# Patient Record
Sex: Male | Born: 2000 | Race: Black or African American | Hispanic: No | Marital: Single | Smoking: Never smoker
Health system: Southern US, Community
[De-identification: ages and names within clinical notes are randomized; demographics above are authoritative.]

## PROBLEM LIST (undated history)

## (undated) DIAGNOSIS — F988 Other specified behavioral and emotional disorders with onset usually occurring in childhood and adolescence: Secondary | ICD-10-CM

## (undated) DIAGNOSIS — Z9109 Other allergy status, other than to drugs and biological substances: Secondary | ICD-10-CM

## (undated) DIAGNOSIS — M84362A Stress fracture, left tibia, initial encounter for fracture: Secondary | ICD-10-CM

## (undated) HISTORY — PX: TIBIA FRACTURE SURGERY: SHX806

---
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
## 2020-12-07 DIAGNOSIS — T782XXA Anaphylactic shock, unspecified, initial encounter: Secondary | ICD-10-CM | POA: Insufficient documentation

## 2020-12-07 DIAGNOSIS — J45909 Unspecified asthma, uncomplicated: Secondary | ICD-10-CM | POA: Insufficient documentation

## 2020-12-07 DIAGNOSIS — Z7951 Long term (current) use of inhaled steroids: Secondary | ICD-10-CM | POA: Diagnosis not present

## 2020-12-07 DIAGNOSIS — L500 Allergic urticaria: Secondary | ICD-10-CM | POA: Insufficient documentation

## 2020-12-08 ENCOUNTER — Emergency Department (HOSPITAL_BASED_OUTPATIENT_CLINIC_OR_DEPARTMENT_OTHER)
Admission: EM | Admit: 2020-12-08 | Discharge: 2020-12-08 | Disposition: A | Discharge status: Home / Self Care | Payer: No Typology Code available for payment source | Attending: Emergency Medicine | Admitting: Emergency Medicine

## 2020-12-08 ENCOUNTER — Other Ambulatory Visit: Payer: Self-pay

## 2020-12-08 ENCOUNTER — Encounter (HOSPITAL_BASED_OUTPATIENT_CLINIC_OR_DEPARTMENT_OTHER): Payer: Self-pay | Admitting: *Deleted

## 2020-12-08 DIAGNOSIS — T782XXA Anaphylactic shock, unspecified, initial encounter: Secondary | ICD-10-CM

## 2020-12-08 LAB — TROPONIN I (HIGH SENSITIVITY): Troponin I (High Sensitivity): <2 ng/L (ref <18)

## 2020-12-08 MED ORDER — EPINEPHRINE 0.3 MG/0.3ML IJ SOAJ
0.3000 mg | Freq: Once | INTRAMUSCULAR | Status: AC
Start: 2020-12-08 — End: 2020-12-08

## 2020-12-08 MED ORDER — DIPHENHYDRAMINE HCL 50 MG/ML IJ SOLN
25.0000 mg | Freq: Once | INTRAMUSCULAR | Status: AC
  Administered 2020-12-08: 25 mg via INTRAVENOUS

## 2020-12-08 MED ORDER — FAMOTIDINE IN NACL 20-0.9 MG/50ML-% IV SOLN
INTRAVENOUS | Status: AC
  Administered 2020-12-08: 20 mg via INTRAVENOUS
  Filled 2020-12-08: qty 50

## 2020-12-08 MED ORDER — METHYLPREDNISOLONE SODIUM SUCC 125 MG IJ SOLR: 125.0000 mg | Freq: Once | INTRAMUSCULAR | Status: AC

## 2020-12-08 MED ORDER — DIPHENHYDRAMINE HCL 50 MG/ML IJ SOLN
INTRAMUSCULAR | Status: AC
  Filled 2020-12-08: qty 1

## 2020-12-08 MED ORDER — METHYLPREDNISOLONE SODIUM SUCC 125 MG IJ SOLR
INTRAMUSCULAR | Status: AC
  Administered 2020-12-08: 125 mg via INTRAVENOUS
  Filled 2020-12-08: qty 2

## 2020-12-08 MED ORDER — EPINEPHRINE 0.3 MG/0.3ML IJ SOAJ: 0.3000 mg | INTRAMUSCULAR | 0 refills | Status: DC | PRN

## 2020-12-08 MED ORDER — PREDNISONE 50 MG PO TABS: ORAL_TABLET | ORAL | 0 refills | Status: DC

## 2020-12-08 MED ORDER — PREDNISONE 50 MG PO TABS: ORAL_TABLET | ORAL | 0 refills | Status: AC

## 2020-12-08 MED ORDER — EPINEPHRINE 0.3 MG/0.3ML IJ SOAJ
INTRAMUSCULAR | Status: AC
  Administered 2020-12-08: 0.3 mg via INTRAMUSCULAR
  Filled 2020-12-08: qty 0.3

## 2020-12-08 MED ORDER — EPINEPHRINE 0.3 MG/0.3ML IJ SOAJ: 0.3000 mg | INTRAMUSCULAR | 0 refills | Status: AC | PRN

## 2020-12-08 MED ORDER — FAMOTIDINE IN NACL 20-0.9 MG/50ML-% IV SOLN: 20.0000 mg | Freq: Once | INTRAVENOUS | Status: AC

## 2020-12-08 NOTE — ED Notes (Signed)
MD to triage to eval pt

## 2020-12-08 NOTE — ED Triage Notes (Addendum)
C/o allergic reaction to unknown x 30 mins , facial swelling including lips,hives to arms chest and back and reports throat feeling full, , PTA benadryl 2 tsp

## 2020-12-08 NOTE — Discharge Instructions (Signed)
Take the steroids as prescribed.  Use Benadryl as needed for itching.  Epinephrine pen is for severe allergic reaction with difficulty breathing, chest pain, throat swelling, tongue swelling only.  If you use the epinephrine pen you must come to the hospital afterwards. Return to the ED if you develop any other concerns.

## 2020-12-08 NOTE — ED Provider Notes (Signed)
[facility] EMERGENCY DEPARTMENT Provider Note   CSN: [id number] Arrival date & time: 12/07/20  2358     History Chief Complaint  Patient presents with   Allergic Reaction    Luke Nguyen is a 20 y.o. male.  Patient here with allergic reaction to unknown allergen.  He was at a [place] prior to arrival.  He developed tightness in his throat with itching, facial swelling, hives to his arms, chest, back and throat feeling full and tight and itching.  He took Benadryl at home without relief.  Has known allergy to amoxicillin and shellfish.  Does not believe he had any shellfish.  This is never happened before.  No chest pain or shortness of breath. Complains of tightness to his throat, lip swelling, hives to his arms, chest and back.  Denies any shortness of breath.  States his throat is starting to feel better.  The history is provided by the patient.  Allergic Reaction Presenting symptoms: difficulty swallowing and rash       Past Medical History:  Diagnosis Date   ADD (attention deficit disorder)    no current med.   Asthma    daily and prn inhalers   Environmental allergies    takes weekly allergy shots   Stress fracture of left tibia 02/2016    Patient Active Problem List   Diagnosis Date Noted   Stress fracture of left tibia 01/29/2016   Sports physical 10/24/2015    Past Surgical History:  Procedure Laterality Date   TIBIA FRACTURE SURGERY     TIBIA IM NAIL INSERTION Left 03/12/2016   Procedure: INTRAMEDULLARY (IM) NAIL TIBIA;  Surgeon: Jodi Geralds, MD;  Location: [facility];  Service: Orthopedics;  Laterality: Left;   TONSILLECTOMY AND ADENOIDECTOMY  03/11/2004       Family History  Problem Relation Age of Onset   Von Willebrand disease Mother    Asthma Mother    Asthma Sister    Congestive Heart Failure Father    Diabetes Maternal Grandmother    Hypertension Maternal Grandmother    Heart disease Maternal Grandfather     Diabetes Paternal Grandmother    Cirrhosis Paternal Grandfather     Social History   Tobacco Use   Smoking status: Never   Smokeless tobacco: Never  Vaping Use   Vaping Use: Never used  Substance Use Topics   Alcohol use: No   Drug use: No    Home Medications Prior to Admission medications   Medication Sig Start Date End Date Taking? Authorizing Provider  albuterol (PROVENTIL HFA;VENTOLIN HFA) 108 (90 Base) MCG/ACT inhaler INHALE 2 PUFFS INTO THE LUNGS EVERY 4 HOURS AS NEEDED FOR WHEEZING OR SHORTNESS OF BREATH 11/20/17   Padgett, Pilar Grammes, MD  azelastine (ASTELIN) 0.1 % nasal spray Place 2 sprays into both nostrils 2 (two) times daily. Use in each nostril as directed 09/18/16   Marcelyn Bruins, MD  cephALEXin (KEFLEX) 500 MG capsule Take 1 capsule (500 mg total) by mouth 4 (four) times daily. 01/09/17   Law, Waylan Boga, PA-C  cetirizine (ZYRTEC) 10 MG tablet Take one tablet once a day as needed for runny nose. 07/28/17   Marcelyn Bruins, MD  fluticasone Aleda Grana) 50 MCG/ACT nasal spray Place 2 sprays into both nostrils every morning. 09/18/16   Padgett, Pilar Grammes, MD  fluticasone (FLOVENT HFA) 110 MCG/ACT inhaler Inhale 2 puffs into the lungs 2 (two) times daily. 09/18/16   Marcelyn Bruins, MD  hydrocortisone  2.5 % lotion Apply topically 2 (two) times daily. 01/09/17   Law, Waylan Boga, PA-C  montelukast (SINGULAIR) 10 MG tablet Take 1 tablet (10 mg total) by mouth at bedtime. 05/04/17   Marcelyn Bruins, MD    Allergies    Shellfish allergy and Amoxicillin  Review of Systems   Review of Systems  Constitutional:  Negative for activity change, appetite change and fever.  HENT:  Positive for trouble swallowing and voice change. Negative for congestion.   Respiratory:  Negative for cough, chest tightness and shortness of breath.   Cardiovascular:  Negative for chest pain.  Gastrointestinal:  Negative for nausea and vomiting.   Genitourinary:  Negative for dysuria and hematuria.  Musculoskeletal:  Negative for arthralgias and myalgias.  Skin:  Positive for rash.  Neurological:  Negative for dizziness, weakness and headaches.   all other systems are negative except as noted in the HPI and PMH.   Physical Exam Updated Vital Signs BP 119/81   Pulse 79   Temp 97.9 F (36.6 C) (Oral)   Resp 16   Ht 6\' 2"  (1.88 m)   Wt 112 kg   SpO2 95%   BMI 31.71 kg/m   Physical Exam Vitals and nursing note reviewed.  Constitutional:      General: He is not in acute distress.    Appearance: He is well-developed.  HENT:     Head: Normocephalic and atraumatic.     Mouth/Throat:     Pharynx: No oropharyngeal exudate.     Comments: Swelling of lips.  Tongue appears normal.  No drooling or stridor.  Oropharynx is clear.  Eyes:     Conjunctiva/sclera: Conjunctivae normal.     Pupils: Pupils are equal, round, and reactive to light.  Neck:     Comments: No meningismus. Cardiovascular:     Rate and Rhythm: Normal rate and regular rhythm.     Heart sounds: Normal heart sounds. No murmur heard. Pulmonary:     Effort: Pulmonary effort is normal. No respiratory distress.     Breath sounds: Normal breath sounds.  Abdominal:     Palpations: Abdomen is soft.     Tenderness: There is no abdominal tenderness. There is no guarding or rebound.  Musculoskeletal:        General: No tenderness. Normal range of motion.     Cervical back: Normal range of motion and neck supple.  Skin:    General: Skin is warm.     Findings: Rash present.     Comments: Diffuse urticaria involving arms, back, chest and abdomen  Neurological:     Mental Status: He is alert and oriented to person, place, and time.     Cranial Nerves: No cranial nerve deficit.     Motor: No abnormal muscle tone.     Coordination: Coordination normal.     Comments:  5/5 strength throughout. CN 2-12 intact.Equal grip strength.   Psychiatric:        Behavior:  Behavior normal.    ED Results / Procedures / Treatments   Labs (all labs ordered are listed, but only abnormal results are displayed) Labs Reviewed  TROPONIN I (HIGH SENSITIVITY)  TROPONIN I (HIGH SENSITIVITY)    EKG EKG Interpretation  Date/Time:  Saturday December 08 2020 04:12:02 EDT Ventricular Rate:  65 PR Interval:  139 QRS Duration: 95 QT Interval:  373 QTC Calculation: 388 R Axis:   69 Text Interpretation: Sinus rhythm ST elev, probable normal early repol pattern No significant change  was found Confirmed by Glynn Octave [phone]) on 12/08/2020 4:14:52 AM  Radiology No results found.  Procedures .Critical Care Performed by: Glynn Octave, MD Authorized by: Glynn Octave, MD   Critical care provider statement:    Critical care time (minutes):  45   Critical care was time spent personally by me on the following activities:  Discussions with consultants, evaluation of patient's response to treatment, examination of patient, ordering and performing treatments and interventions, ordering and review of laboratory studies, ordering and review of radiographic studies, pulse oximetry, re-evaluation of patient's condition, obtaining history from patient or surrogate and review of old charts (anaphylaxis)   Medications Ordered in ED Medications  EPINEPHrine (EPI-PEN) 0.3 mg/0.3 mL injection (has no administration in time range)  EPINEPHrine (EPI-PEN) injection 0.3 mg (has no administration in time range)  methylPREDNISolone sodium succinate (SOLU-MEDROL) 125 mg/2 mL injection 125 mg (has no administration in time range)  diphenhydrAMINE (BENADRYL) injection 25 mg (has no administration in time range)  famotidine (PEPCID) IVPB 20 mg premix (has no administration in time range)    ED Course  I have reviewed the triage vital signs and the nursing notes.  Pertinent labs & imaging results that were available during my care of the patient were reviewed by me and  considered in my medical decision making (see chart for details).    MDM Rules/Calculators/A&P                          Allergic reaction with throat tightness and lip swelling consistent with anaphylaxis.  He will be given epinephrine, steroids, Benadryl and antihistamines  Patient observed in the ED with no deterioration in condition.  Vitals remained stable.  No hypoxia or increased work of breathing.  EKG does show some ST elevation in V2, V3 and V4, more progressed from 2016.  No reciprocal change.  Likely early repolarization.  Patient with no chest pain or shortness of breath.  Discussed with Dr. Welton Flakes cardiology who reviewed EKG and agrees unlikely to be STEMI given presentation but does recommend troponin.  Observed for 4 hours after epinephrine injection with no deterioration.  Feels improved.  Throat feels normal.  Rash has resolved.  No chest pain or shortness of breath.  Troponin was negative.  Patient asymptomatic.  Rash has resolved.  No chest pain or shortness of breath. Low suspicion for ACS with negative troponin 4 hours after initial presentation.  Discussed steroids and antihistamines for home use.  Epinephrine pen given with instructions.  Return precautions discussed. Final Clinical Impression(s) / ED Diagnoses Final diagnoses:  Anaphylaxis, initial encounter    Rx / DC Orders ED Discharge Orders     None        Perris Tripathi, Jeannett Senior, MD 12/08/20 [phone]

## 2021-01-31 ENCOUNTER — Other Ambulatory Visit: Payer: Self-pay

## 2021-01-31 ENCOUNTER — Encounter (HOSPITAL_BASED_OUTPATIENT_CLINIC_OR_DEPARTMENT_OTHER): Payer: Self-pay

## 2021-01-31 ENCOUNTER — Emergency Department (HOSPITAL_BASED_OUTPATIENT_CLINIC_OR_DEPARTMENT_OTHER)
Admission: EM | Admit: 2021-01-31 | Discharge: 2021-01-31 | Disposition: A | Discharge status: Home / Self Care | Payer: No Typology Code available for payment source | Attending: Emergency Medicine | Admitting: Emergency Medicine

## 2021-01-31 DIAGNOSIS — Z20822 Contact with and (suspected) exposure to covid-19: Secondary | ICD-10-CM | POA: Diagnosis not present

## 2021-01-31 DIAGNOSIS — J45909 Unspecified asthma, uncomplicated: Secondary | ICD-10-CM | POA: Diagnosis not present

## 2021-01-31 DIAGNOSIS — J3489 Other specified disorders of nose and nasal sinuses: Secondary | ICD-10-CM | POA: Diagnosis not present

## 2021-01-31 DIAGNOSIS — J101 Influenza due to other identified influenza virus with other respiratory manifestations: Secondary | ICD-10-CM | POA: Diagnosis not present

## 2021-01-31 DIAGNOSIS — Z7951 Long term (current) use of inhaled steroids: Secondary | ICD-10-CM | POA: Insufficient documentation

## 2021-01-31 DIAGNOSIS — R509 Fever, unspecified: Secondary | ICD-10-CM | POA: Diagnosis present

## 2021-01-31 LAB — RESP PANEL BY RT-PCR (FLU A&B, COVID) ARPGX2
Influenza A by PCR: POSITIVE — AB
Influenza B by PCR: NEGATIVE
SARS Coronavirus 2 by RT PCR: NEGATIVE

## 2021-01-31 MED ORDER — ACETAMINOPHEN 325 MG PO TABS
650.0000 mg | ORAL_TABLET | Freq: Once | ORAL | Status: AC | PRN
  Administered 2021-01-31: 650 mg via ORAL
  Filled 2021-01-31: qty 2

## 2021-01-31 NOTE — ED Triage Notes (Signed)
Pt c/o fever, cough, congestion, chills, and emesis x 1 day.  Pain scoe 8/10.  Pt reports taking OTC medications w/ minimal relief.

## 2021-01-31 NOTE — ED Provider Notes (Signed)
[facility] EMERGENCY DEPARTMENT Provider Note   CSN: [id number] Arrival date & time: 01/31/21  0803     History Chief Complaint  Patient presents with   Fever   Cough   Nasal Congestion    Luke Nguyen is a 20 y.o. male with a past medical history of asthma who presents to the emergency department complaining of fever (max temp of 100.3) onset last night.  His girlfriend was just seen in the emergency department and diagnosed with influenza yesterday. He has associated symptoms of cough, sore throat, nasal congestion, rhinorrhea, chills, and emesis x1 episode last night. Pt denies blood in emesis. Patient has tried over-the-counter Tylenol and Mucinex with minimal relief of his symptoms. He denies chest pain, abdominal pain, or shortness of breath.  Patient has not had to use his inhaler.  The history is provided by the patient. No language interpreter was used.      Past Medical History:  Diagnosis Date   ADD (attention deficit disorder)    no current med.   Asthma    daily and prn inhalers   Environmental allergies    takes weekly allergy shots   Stress fracture of left tibia 02/2016    Patient Active Problem List   Diagnosis Date Noted   Stress fracture of left tibia 01/29/2016   Sports physical 10/24/2015    Past Surgical History:  Procedure Laterality Date   TIBIA FRACTURE SURGERY     TIBIA IM NAIL INSERTION Left 03/12/2016   Procedure: INTRAMEDULLARY (IM) NAIL TIBIA;  Surgeon: Jodi Geralds, MD;  Location: [facility];  Service: Orthopedics;  Laterality: Left;   TONSILLECTOMY AND ADENOIDECTOMY  03/11/2004       Family History  Problem Relation Age of Onset   Von Willebrand disease Mother    Asthma Mother    Asthma Sister    Congestive Heart Failure Father    Diabetes Maternal Grandmother    Hypertension Maternal Grandmother    Heart disease Maternal Grandfather    Diabetes Paternal Grandmother    Cirrhosis Paternal Grandfather      Social History   Tobacco Use   Smoking status: Never   Smokeless tobacco: Never  Vaping Use   Vaping Use: Never used  Substance Use Topics   Alcohol use: No   Drug use: No    Home Medications Prior to Admission medications   Medication Sig Start Date End Date Taking? Authorizing Provider  albuterol (PROVENTIL HFA;VENTOLIN HFA) 108 (90 Base) MCG/ACT inhaler INHALE 2 PUFFS INTO THE LUNGS EVERY 4 HOURS AS NEEDED FOR WHEEZING OR SHORTNESS OF BREATH 11/20/17   Padgett, Pilar Grammes, MD  azelastine (ASTELIN) 0.1 % nasal spray Place 2 sprays into both nostrils 2 (two) times daily. Use in each nostril as directed 09/18/16   Marcelyn Bruins, MD  cephALEXin (KEFLEX) 500 MG capsule Take 1 capsule (500 mg total) by mouth 4 (four) times daily. 01/09/17   Law, Waylan Boga, PA-C  cetirizine (ZYRTEC) 10 MG tablet Take one tablet once a day as needed for runny nose. 07/28/17   Marcelyn Bruins, MD  EPINEPHrine 0.3 mg/0.3 mL IJ SOAJ injection Inject 0.3 mg into the muscle as needed for anaphylaxis. 12/08/20   Rancour, Jeannett Senior, MD  fluticasone Aleda Grana) 50 MCG/ACT nasal spray Place 2 sprays into both nostrils every morning. 09/18/16   Marcelyn Bruins, MD  fluticasone (FLOVENT HFA) 110 MCG/ACT inhaler Inhale 2 puffs into the lungs 2 (two) times daily. 09/18/16  Marcelyn Bruins, MD  hydrocortisone 2.5 % lotion Apply topically 2 (two) times daily. 01/09/17   Law, Waylan Boga, PA-C  montelukast (SINGULAIR) 10 MG tablet Take 1 tablet (10 mg total) by mouth at bedtime. 05/04/17   Marcelyn Bruins, MD  predniSONE (DELTASONE) 50 MG tablet 1 tablet PO daily 12/08/20   Rancour, Jeannett Senior, MD    Allergies    Shellfish allergy and Amoxicillin  Review of Systems   Review of Systems  Constitutional:  Positive for fever. Negative for chills.  HENT:  Positive for congestion, rhinorrhea and sore throat.   Respiratory:  Positive for cough. Negative for shortness of  breath.   Cardiovascular:  Negative for chest pain.  Gastrointestinal:  Positive for nausea and vomiting (resolved). Negative for abdominal pain.  Musculoskeletal:  Positive for myalgias.  Skin:  Negative for rash.  Neurological:  Positive for headaches.  All other systems reviewed and are negative.  Physical Exam Updated Vital Signs BP (!) 105/56   Pulse (!) 108   Temp 99.2 F (37.3 C) (Oral)   Resp 18   Ht 6\' 1"  (1.854 m)   Wt 113.4 kg   SpO2 95%   BMI 32.98 kg/m    Physical Exam Constitutional:      General: He is not in acute distress.    Appearance: He is well-developed.  HENT:     Head: Normocephalic and atraumatic.     Right Ear: Tympanic membrane, ear canal and external ear normal.     Left Ear: Tympanic membrane, ear canal and external ear normal.     Nose: Mucosal edema and rhinorrhea present.     Comments: Dried nasal secretions    Mouth/Throat:     Mouth: Mucous membranes are moist.     Pharynx: Oropharynx is clear. Uvula midline. No oropharyngeal exudate or posterior oropharyngeal erythema.     Tonsils: No tonsillar abscesses.  Eyes:     Extraocular Movements: Extraocular movements intact.     Conjunctiva/sclera: Conjunctivae normal.  Cardiovascular:     Rate and Rhythm: Normal rate and regular rhythm.     Pulses: Normal pulses.     Heart sounds: Normal heart sounds.  Pulmonary:     Effort: Pulmonary effort is normal. No respiratory distress.     Breath sounds: Normal breath sounds. No wheezing or rales.     Comments: Lungs clear to auscultation bilaterally Abdominal:     General: Bowel sounds are normal. There is no distension.     Palpations: Abdomen is soft. There is no mass.     Tenderness: There is no abdominal tenderness.  Musculoskeletal:        General: Normal range of motion.     Cervical back: Neck supple.  Skin:    General: Skin is warm and dry.     Findings: No rash.  Neurological:     Mental Status: He is alert and oriented to  person, place, and time.  Psychiatric:        Behavior: Behavior normal.    ED Results / Procedures / Treatments   Labs (all labs ordered are listed, but only abnormal results are displayed) Labs Reviewed  RESP PANEL BY RT-PCR (FLU A&B, COVID) ARPGX2 - Abnormal; Notable for the following components:      Result Value   Influenza A by PCR POSITIVE (*)    All other components within normal limits    EKG None  Radiology No results found.  Procedures Procedures   Medications Ordered  in ED Medications  acetaminophen (TYLENOL) tablet 650 mg (650 mg Oral Given 01/31/21 7026)    ED Course  I have reviewed the triage vital signs and the nursing notes.  Pertinent labs & imaging results that were available during my care of the patient were reviewed by me and considered in my medical decision making (see chart for details).    MDM Rules/Calculators/A&P                          Patient with fever onset 1 day.  Sick contacts at home.  Given Tylenol in the emergency department for symptomatic therapy. Differential diagnosis includes COVID, Flu, or viral URI with cough. On exam patient with dried nasal secretions and rhinorrhea otherwise cardiovascular, pulmonary, abdominal exam without acute findings. Swabbed for RSV, COVID, flu.  Results showed RSV and COVID-negative.  Flu swab positive for influenza A. Influenza as likely etiology. Patient is otherwise healthy and not immunocompromised. Repeat vital signs show patient afebrile with temperature at 99.2 and O2 sats at 99%. Pt stable with patent airway, no concern for airway compromise.  Discussed with patient regarding use of Tamiflu and discussed thoroughly on its risks and benefits. Follow discussion, patient opted for supportive care at this time. Work note provided. Discussed with patient that they should not attend work until they are fever free for 24 hours.  Supportive care and strict return precautions discussed with patient.   Patient acknowledges and voices understanding. Appears safe for discharge at this time.  Follow-up as indicated in discharge paperwork.   Final Clinical Impression(s) / ED Diagnoses Final diagnoses:  Influenza A    Rx / DC Orders ED Discharge Orders     None        Duwan Adrian A, PA 01/31/21 2309    Tegeler, Canary Brim, MD 02/01/21 [phone]

## 2021-01-31 NOTE — Discharge Instructions (Addendum)
You may take over the counter Ibuprofen or Tylenol as directed.  Ensure to maintain fluid intake. Return to the Emergency Department if you are experiencing trouble breathing, increasing chest pain, decreased fluid intake, or worsening symptoms.

## 2021-12-11 ENCOUNTER — Other Ambulatory Visit: Payer: Self-pay

## 2021-12-11 ENCOUNTER — Encounter (HOSPITAL_BASED_OUTPATIENT_CLINIC_OR_DEPARTMENT_OTHER): Payer: Self-pay

## 2021-12-11 ENCOUNTER — Emergency Department (HOSPITAL_BASED_OUTPATIENT_CLINIC_OR_DEPARTMENT_OTHER)
Admission: EM | Admit: 2021-12-11 | Discharge: 2021-12-11 | Disposition: A | Discharge status: Home / Self Care | Payer: No Typology Code available for payment source | Attending: Emergency Medicine | Admitting: Emergency Medicine

## 2021-12-11 DIAGNOSIS — J45909 Unspecified asthma, uncomplicated: Secondary | ICD-10-CM | POA: Diagnosis not present

## 2021-12-11 DIAGNOSIS — Z7951 Long term (current) use of inhaled steroids: Secondary | ICD-10-CM | POA: Diagnosis not present

## 2021-12-11 DIAGNOSIS — Z20822 Contact with and (suspected) exposure to covid-19: Secondary | ICD-10-CM | POA: Diagnosis not present

## 2021-12-11 DIAGNOSIS — J1089 Influenza due to other identified influenza virus with other manifestations: Secondary | ICD-10-CM | POA: Insufficient documentation

## 2021-12-11 DIAGNOSIS — J111 Influenza due to unidentified influenza virus with other respiratory manifestations: Secondary | ICD-10-CM

## 2021-12-11 DIAGNOSIS — R509 Fever, unspecified: Secondary | ICD-10-CM | POA: Diagnosis present

## 2021-12-11 LAB — RESP PANEL BY RT-PCR (FLU A&B, COVID) ARPGX2
Influenza A by PCR: NEGATIVE
Influenza B by PCR: NEGATIVE
SARS Coronavirus 2 by RT PCR: NEGATIVE

## 2021-12-11 LAB — SARS CORONAVIRUS 2 BY RT PCR: SARS Coronavirus 2 by RT PCR: NEGATIVE

## 2021-12-11 MED ORDER — ACETAMINOPHEN 325 MG PO TABS
650.0000 mg | ORAL_TABLET | Freq: Once | ORAL | Status: AC
  Administered 2021-12-11: 650 mg via ORAL
  Filled 2021-12-11: qty 2

## 2021-12-11 NOTE — ED Triage Notes (Signed)
Pt has had body aches and fever and wants a COVID test.

## 2021-12-11 NOTE — Discharge Instructions (Addendum)
Note your work-up today was overall reassuring.  Your COVID test was negative.  Follow MyChart for the results of your influenza testing.  Continue taking Tylenol/ibuprofen as needed for pain/fever.  Recommend close follow-up with PCP in 3 to 5 days for reevaluation of symptoms.  Please do not hesitate to return to the emergency department the worrisome signs and symptoms we discussed become apparent.

## 2021-12-11 NOTE — ED Provider Notes (Signed)
[facility] EMERGENCY DEPARTMENT Provider Note   CSN: [id number] Arrival date & time: 12/11/21  1853     History  Chief Complaint  Patient presents with   Fever    Luke Nguyen is a 21 y.o. male.   Fever   21 year old male presents emergency department with complaints of a fever, chills, body aches, sore throat that began insidiously last night.  He states that he has not taken any medication for this.  Denies any known sick contact but states he interacts with multiple people on a daily basis.  He states that his fiance began symptoms about the same time.  Denies difficulty breathing, difficulty swallowing, neck stiffness/rigidity, headache, chest pain, shortness of breath, abdominal pain, nausea, vomiting, urinary symptoms, change in bowel habits.  Past medical history significant for ADD, asthma, allergies  Home Medications Prior to Admission medications   Medication Sig Start Date End Date Taking? Authorizing Provider  albuterol (PROVENTIL HFA;VENTOLIN HFA) 108 (90 Base) MCG/ACT inhaler INHALE 2 PUFFS INTO THE LUNGS EVERY 4 HOURS AS NEEDED FOR WHEEZING OR SHORTNESS OF BREATH 11/20/17   Padgett, Pilar Grammes, MD  azelastine (ASTELIN) 0.1 % nasal spray Place 2 sprays into both nostrils 2 (two) times daily. Use in each nostril as directed 09/18/16   Marcelyn Bruins, MD  cephALEXin (KEFLEX) 500 MG capsule Take 1 capsule (500 mg total) by mouth 4 (four) times daily. 01/09/17   Law, Waylan Boga, PA-C  cetirizine (ZYRTEC) 10 MG tablet Take one tablet once a day as needed for runny nose. 07/28/17   Marcelyn Bruins, MD  EPINEPHrine 0.3 mg/0.3 mL IJ SOAJ injection Inject 0.3 mg into the muscle as needed for anaphylaxis. 12/08/20   Rancour, Jeannett Senior, MD  fluticasone Aleda Grana) 50 MCG/ACT nasal spray Place 2 sprays into both nostrils every morning. 09/18/16   Marcelyn Bruins, MD  fluticasone (FLOVENT HFA) 110 MCG/ACT inhaler Inhale 2 puffs into the  lungs 2 (two) times daily. 09/18/16   Marcelyn Bruins, MD  hydrocortisone 2.5 % lotion Apply topically 2 (two) times daily. 01/09/17   Law, Waylan Boga, PA-C  montelukast (SINGULAIR) 10 MG tablet Take 1 tablet (10 mg total) by mouth at bedtime. 05/04/17   Marcelyn Bruins, MD  predniSONE (DELTASONE) 50 MG tablet 1 tablet PO daily 12/08/20   Rancour, Jeannett Senior, MD      Allergies    Shellfish allergy and Amoxicillin    Review of Systems   Review of Systems  Constitutional:  Positive for fever.  All other systems reviewed and are negative.   Physical Exam Updated Vital Signs BP 137/69 (BP Location: Right Arm)   Pulse (!) 103   Temp 100.1 F (37.8 C) (Oral)   Resp 20   Ht 6' (1.829 m)   Wt 111.1 kg   SpO2 97%   BMI 33.23 kg/m  Physical Exam Vitals and nursing note reviewed.  Constitutional:      General: He is not in acute distress.    Appearance: Normal appearance. He is well-developed.  HENT:     Head: Normocephalic and atraumatic.     Nose: Nose normal.     Mouth/Throat:     Comments: Mild posterior pharyngeal erythema with no obvious exudate.  Tonsils 1+ bilaterally with no exudate.  Dentition in good repair. Eyes:     Conjunctiva/sclera: Conjunctivae normal.  Cardiovascular:     Rate and Rhythm: Normal rate and regular rhythm.     Heart sounds: No murmur heard. Pulmonary:  Effort: Pulmonary effort is normal. No respiratory distress.     Breath sounds: Normal breath sounds. No wheezing or rales.  Abdominal:     Palpations: Abdomen is soft.     Tenderness: There is no abdominal tenderness. There is no right CVA tenderness or left CVA tenderness.  Musculoskeletal:        General: No swelling.     Cervical back: Normal range of motion and neck supple. No rigidity or tenderness.  Skin:    General: Skin is warm and dry.     Capillary Refill: Capillary refill takes less than 2 seconds.  Neurological:     Mental Status: He is alert.  Psychiatric:         Mood and Affect: Mood normal.     ED Results / Procedures / Treatments   Labs (all labs ordered are listed, but only abnormal results are displayed) Labs Reviewed  SARS CORONAVIRUS 2 BY RT PCR  RESP PANEL BY RT-PCR (FLU A&B, COVID) ARPGX2    EKG None  Radiology No results found.  Procedures Procedures    Medications Ordered in ED Medications  acetaminophen (TYLENOL) tablet 650 mg (650 mg Oral Given 12/11/21 1923)    ED Course/ Medical Decision Making/ A&P                           Medical Decision Making Risk OTC drugs.   This patient presents to the ED for concern of flulike symptoms, this involves an extensive number of treatment options, and is a complaint that carries with it a high risk of complications and morbidity.  The differential diagnosis includes COVID, influenza, URI, meningitis, pneumonia   Co morbidities that complicate the patient evaluation  See HPI   Additional history obtained:  Additional history obtained from EMR  Lab Tests:  I Ordered, and personally interpreted labs.  The pertinent results include: Respiratory viral panel.  Patient negative for COVID.  Influenza pending upon discharge.   Imaging Studies ordered:  Chest x-ray considered but deemed necessary due to no obvious wheeze, rhonchi, Rales auscultated on lung exam  Cardiac Monitoring: / EKG:  The patient was maintained on a cardiac monitor.  I personally viewed and interpreted the cardiac monitored which showed an underlying rhythm of: Sinus rhythm   Consultations Obtained:  N/a   Problem List / ED Course / Critical interventions / Medication management  Flulike symptoms I ordered medication including Tylenol for fever   Reevaluation of the patient after these medicines showed that the patient improved I have reviewed the patients home medicines and have made adjustments as needed   Social Determinants of Health:  Denies tobacco, illicit drug use   Test /  Admission - Considered:  Flulike symptoms Vitals signs significant for 100.3 originally which decreased to 100.1 with administration of Tylenol.. Otherwise within normal range and stable throughout visit. Laboratory/imaging studies significant for: See above Patient symptoms likely secondary to viral URI given acute nature of symptom onset as well as fianc experiencing similar symptoms.  Coronavirus negative.  Influenza still pending at this time.  Patient recommended symptomatic therapy with Tylenol/ibuprofen as needed for pain/fever.  Recommended continued adequate oral rehydration as well as food intake.  Close follow-up with PCP recommended for reevaluation in 3 to 5 days.  Treatment plan was discussed at length with patient he knowledge understanding was agreeable to said plan. Worrisome signs and symptoms were discussed with the patient, and the patient acknowledged understanding  to return to the ED if noticed. Patient was stable upon discharge.          Final Clinical Impression(s) / ED Diagnoses Final diagnoses:  Influenza-like illness    Rx / DC Orders ED Discharge Orders     None         Peter Garter, [place] 12/11/21 2150    Rondel Baton, MD 12/12/21 [phone]

## 2022-01-19 ENCOUNTER — Emergency Department (HOSPITAL_BASED_OUTPATIENT_CLINIC_OR_DEPARTMENT_OTHER)
Admission: EM | Admit: 2022-01-19 | Discharge: 2022-01-19 | Disposition: A | Discharge status: Home / Self Care | Payer: No Typology Code available for payment source | Attending: Emergency Medicine | Admitting: Emergency Medicine

## 2022-01-19 ENCOUNTER — Encounter (HOSPITAL_BASED_OUTPATIENT_CLINIC_OR_DEPARTMENT_OTHER): Payer: Self-pay | Admitting: Emergency Medicine

## 2022-01-19 DIAGNOSIS — Z20822 Contact with and (suspected) exposure to covid-19: Secondary | ICD-10-CM | POA: Insufficient documentation

## 2022-01-19 DIAGNOSIS — R197 Diarrhea, unspecified: Secondary | ICD-10-CM | POA: Insufficient documentation

## 2022-01-19 DIAGNOSIS — R112 Nausea with vomiting, unspecified: Secondary | ICD-10-CM | POA: Insufficient documentation

## 2022-01-19 DIAGNOSIS — R109 Unspecified abdominal pain: Secondary | ICD-10-CM | POA: Insufficient documentation

## 2022-01-19 LAB — COMPREHENSIVE METABOLIC PANEL
ALT: 38 U/L (ref 0–44)
AST: 26 U/L (ref 15–41)
Albumin: 4.3 g/dL (ref 3.5–5.0)
Alkaline Phosphatase: 70 U/L (ref 38–126)
Anion gap: 6 (ref 5–15)
BUN: 12 mg/dL (ref 6–20)
CO2: 28 mmol/L (ref 22–32)
Calcium: 8.5 mg/dL — ABNORMAL LOW (ref 8.9–10.3)
Chloride: 105 mmol/L (ref 98–111)
Creatinine, Ser: 0.98 mg/dL (ref 0.61–1.24)
GFR, Estimated: >60 mL/min (ref >60)
Glucose, Bld: 101 mg/dL — ABNORMAL HIGH (ref 70–99)
Potassium: 3.3 mmol/L — ABNORMAL LOW (ref 3.5–5.1)
Sodium: 139 mmol/L (ref 135–145)
Total Bilirubin: 0.9 mg/dL (ref 0.3–1.2)
Total Protein: 7.5 g/dL (ref 6.5–8.1)

## 2022-01-19 LAB — URINALYSIS, ROUTINE W REFLEX MICROSCOPIC
Bilirubin Urine: NEGATIVE
Glucose, UA: NEGATIVE mg/dL
Hgb urine dipstick: NEGATIVE
Ketones, ur: NEGATIVE mg/dL
Leukocytes,Ua: NEGATIVE
Nitrite: NEGATIVE
Protein, ur: 30 mg/dL — AB
Specific Gravity, Urine: >=1.03 (ref 1.005–1.030)
pH: 6.5 (ref 5.0–8.0)

## 2022-01-19 LAB — CBC
HCT: 43.5 % (ref 39.0–52.0)
Hemoglobin: 15 g/dL (ref 13.0–17.0)
MCH: 28.5 pg (ref 26.0–34.0)
MCHC: 34.5 g/dL (ref 30.0–36.0)
MCV: 82.7 fL (ref 80.0–100.0)
Platelets: 213 10*3/uL (ref 150–400)
RBC: 5.26 MIL/uL (ref 4.22–5.81)
RDW: 13.3 % (ref 11.5–15.5)
WBC: 7.5 10*3/uL (ref 4.0–10.5)
nRBC: 0 % (ref 0.0–0.2)

## 2022-01-19 LAB — RESP PANEL BY RT-PCR (FLU A&B, COVID) ARPGX2
Influenza A by PCR: NEGATIVE
Influenza B by PCR: NEGATIVE
SARS Coronavirus 2 by RT PCR: NEGATIVE

## 2022-01-19 LAB — URINALYSIS, MICROSCOPIC (REFLEX)

## 2022-01-19 LAB — LIPASE, BLOOD: Lipase: 25 U/L (ref 11–51)

## 2022-01-19 MED ORDER — ONDANSETRON HCL 4 MG PO TABS: 4.0000 mg | ORAL_TABLET | Freq: Three times a day (TID) | ORAL | 0 refills | Status: AC | PRN

## 2022-01-19 MED ORDER — ONDANSETRON 4 MG PO TBDP
4.0000 mg | ORAL_TABLET | Freq: Once | ORAL | Status: AC | PRN
  Administered 2022-01-19: 4 mg via ORAL
  Filled 2022-01-19: qty 1

## 2022-01-19 MED ORDER — DICYCLOMINE HCL 20 MG PO TABS: 20.0000 mg | ORAL_TABLET | Freq: Two times a day (BID) | ORAL | 0 refills | Status: AC

## 2022-01-19 MED ORDER — SODIUM CHLORIDE 0.9 % IV BOLUS
1000.0000 mL | Freq: Once | INTRAVENOUS | Status: AC
  Administered 2022-01-19: 1000 mL via INTRAVENOUS

## 2022-01-19 NOTE — ED Notes (Addendum)
Last episode of vomiting was approx 12 noon today. States he felt ill at approx 2300hrs last PM, also states has had some diarrhea as well.

## 2022-01-19 NOTE — Discharge Instructions (Signed)
Contact a health care provider if you: Cannot keep fluids down. Have symptoms that get worse. Have new symptoms. Feel light-headed or dizzy. Have muscle cramps. Get help right away if you: Have chest pain. Have trouble breathing or you are breathing very quickly. Have a fast heartbeat. Feel extremely weak or you faint. Have a severe headache, a stiff neck, or both. Have a rash. Have severe pain, cramping, or bloating in your abdomen. Have skin that feels cold and clammy. Feel confused. Have pain when you urinate. Have signs of dehydration, such as: Dark urine, very little urine, or no urine. Cracked lips. Dry mouth. Sunken eyes. Sleepiness. Weakness. Have signs of bleeding, such as: Seeing blood in your vomit. Having vomit that looks like coffee grounds. Having bloody or black stools or stools that look like tar.

## 2022-01-19 NOTE — ED Triage Notes (Signed)
Pt reports emesis accompanied by diarrhea and generalized abd pain. Has not tried to see if he can keep fluids down since this morning. No known contacts with illness. Cannot think of anything particular that he ate that could have caused symptoms.

## 2022-01-19 NOTE — ED Provider Notes (Signed)
[facility] EMERGENCY DEPARTMENT Provider Note   CSN: [id number] Arrival date & time: 01/19/22  1613     History {Add pertinent medical, surgical, social history, OB history to HPI:1} Chief Complaint  Patient presents with   Emesis    Luke Nguyen is a 21 y.o. male.  The history is provided by the patient.  Emesis Severity:  Moderate Duration:  13 hours Timing:  Constant Quality:  Stomach contents Able to tolerate:  Liquids and solids Progression:  Improving Chronicity:  New Recent urination:  Normal Relieved by:  None tried Worsened by:  Nothing Associated symptoms: abdominal pain and diarrhea   Associated symptoms: no arthralgias, no chills, no fever, no headaches and no myalgias   Risk factors: no alcohol use, no prior abdominal surgery, no sick contacts, no suspect food intake and no travel to endemic areas        Home Medications Prior to Admission medications   Medication Sig Start Date End Date Taking? Authorizing Provider  albuterol (PROVENTIL HFA;VENTOLIN HFA) 108 (90 Base) MCG/ACT inhaler INHALE 2 PUFFS INTO THE LUNGS EVERY 4 HOURS AS NEEDED FOR WHEEZING OR SHORTNESS OF BREATH 11/20/17   Padgett, Pilar Grammes, MD  azelastine (ASTELIN) 0.1 % nasal spray Place 2 sprays into both nostrils 2 (two) times daily. Use in each nostril as directed 09/18/16   Marcelyn Bruins, MD  cephALEXin (KEFLEX) 500 MG capsule Take 1 capsule (500 mg total) by mouth 4 (four) times daily. 01/09/17   Law, Waylan Boga, PA-C  cetirizine (ZYRTEC) 10 MG tablet Take one tablet once a day as needed for runny nose. 07/28/17   Marcelyn Bruins, MD  EPINEPHrine 0.3 mg/0.3 mL IJ SOAJ injection Inject 0.3 mg into the muscle as needed for anaphylaxis. 12/08/20   Rancour, Jeannett Senior, MD  fluticasone Aleda Grana) 50 MCG/ACT nasal spray Place 2 sprays into both nostrils every morning. 09/18/16   Marcelyn Bruins, MD  fluticasone (FLOVENT HFA) 110 MCG/ACT inhaler Inhale 2  puffs into the lungs 2 (two) times daily. 09/18/16   Marcelyn Bruins, MD  hydrocortisone 2.5 % lotion Apply topically 2 (two) times daily. 01/09/17   Law, Waylan Boga, PA-C  montelukast (SINGULAIR) 10 MG tablet Take 1 tablet (10 mg total) by mouth at bedtime. 05/04/17   Marcelyn Bruins, MD  predniSONE (DELTASONE) 50 MG tablet 1 tablet PO daily 12/08/20   Rancour, Jeannett Senior, MD      Allergies    Shellfish allergy and Amoxicillin    Review of Systems   Review of Systems  Constitutional:  Negative for chills and fever.  Gastrointestinal:  Positive for abdominal pain, diarrhea and vomiting.  Musculoskeletal:  Negative for arthralgias and myalgias.  Neurological:  Negative for headaches.    Physical Exam Updated Vital Signs BP 106/78 (BP Location: Left Arm)   Pulse 87   Temp 99.1 F (37.3 C) (Oral)   Resp 18   SpO2 99%  Physical Exam Vitals and nursing note reviewed.  Constitutional:      General: He is not in acute distress.    Appearance: He is well-developed. He is not diaphoretic.  HENT:     Head: Normocephalic and atraumatic.  Eyes:     General: No scleral icterus.    Conjunctiva/sclera: Conjunctivae normal.  Cardiovascular:     Rate and Rhythm: Normal rate and regular rhythm.     Heart sounds: Normal heart sounds.  Pulmonary:     Effort: Pulmonary effort is normal. No respiratory distress.  Breath sounds: Normal breath sounds.  Abdominal:     General: There is no distension.     Palpations: Abdomen is soft.     Tenderness: There is no abdominal tenderness.     Comments: Mild, diffuse tenderness  Musculoskeletal:     Cervical back: Normal range of motion and neck supple.  Skin:    General: Skin is warm and dry.  Neurological:     Mental Status: He is alert.  Psychiatric:        Behavior: Behavior normal.     ED Results / Procedures / Treatments   Labs (all labs ordered are listed, but only abnormal results are displayed) Labs Reviewed   URINALYSIS, ROUTINE W REFLEX MICROSCOPIC - Abnormal; Notable for the following components:      Result Value   Protein, ur 30 (*)    All other components within normal limits  URINALYSIS, MICROSCOPIC (REFLEX) - Abnormal; Notable for the following components:   Bacteria, UA RARE (*)    All other components within normal limits  RESP PANEL BY RT-PCR (FLU A&B, COVID) ARPGX2  CBC  LIPASE, BLOOD  COMPREHENSIVE METABOLIC PANEL    EKG None  Radiology No results found.  Procedures Procedures  {Document cardiac monitor, telemetry assessment procedure when appropriate:1}  Medications Ordered in ED Medications  sodium chloride 0.9 % bolus 1,000 mL (has no administration in time range)  ondansetron (ZOFRAN-ODT) disintegrating tablet 4 mg (4 mg Oral Given 01/19/22 1643)    ED Course/ Medical Decision Making/ A&P                           Medical Decision Making Amount and/or Complexity of Data Reviewed Labs: ordered.  Risk Prescription drug management.   ***  {Document critical care time when appropriate:1} {Document review of labs and clinical decision tools ie heart score, Chads2Vasc2 etc:1}  {Document your independent review of radiology images, and any outside records:1} {Document your discussion with family members, caretakers, and with consultants:1} {Document social determinants of health affecting pt's care:1} {Document your decision making why or why not admission, treatments were needed:1} Final Clinical Impression(s) / ED Diagnoses Final diagnoses:  None    Rx / DC Orders ED Discharge Orders     None

## 2022-12-12 ENCOUNTER — Other Ambulatory Visit: Payer: Self-pay

## 2022-12-12 ENCOUNTER — Encounter (HOSPITAL_BASED_OUTPATIENT_CLINIC_OR_DEPARTMENT_OTHER): Payer: Self-pay | Admitting: Emergency Medicine

## 2022-12-12 DIAGNOSIS — Z2914 Encounter for prophylactic rabies immune globin: Secondary | ICD-10-CM | POA: Diagnosis not present

## 2022-12-12 DIAGNOSIS — S5012XA Contusion of left forearm, initial encounter: Secondary | ICD-10-CM | POA: Insufficient documentation

## 2022-12-12 DIAGNOSIS — Z203 Contact with and (suspected) exposure to rabies: Secondary | ICD-10-CM | POA: Insufficient documentation

## 2022-12-12 DIAGNOSIS — Z23 Encounter for immunization: Secondary | ICD-10-CM | POA: Insufficient documentation

## 2022-12-12 DIAGNOSIS — S59912A Unspecified injury of left forearm, initial encounter: Secondary | ICD-10-CM | POA: Diagnosis present

## 2022-12-12 DIAGNOSIS — J45909 Unspecified asthma, uncomplicated: Secondary | ICD-10-CM | POA: Insufficient documentation

## 2022-12-12 DIAGNOSIS — W540XXA Bitten by dog, initial encounter: Secondary | ICD-10-CM | POA: Insufficient documentation

## 2022-12-12 NOTE — ED Triage Notes (Signed)
Patient states he was bit by his neighbors dog on left wrist at approx 2:45pm today. Unknown if up to date on shots.

## 2022-12-13 ENCOUNTER — Emergency Department (HOSPITAL_BASED_OUTPATIENT_CLINIC_OR_DEPARTMENT_OTHER)
Admission: EM | Admit: 2022-12-13 | Discharge: 2022-12-13 | Disposition: A | Discharge status: Home / Self Care | Payer: No Typology Code available for payment source | Attending: Emergency Medicine | Admitting: Emergency Medicine

## 2022-12-13 ENCOUNTER — Emergency Department (HOSPITAL_BASED_OUTPATIENT_CLINIC_OR_DEPARTMENT_OTHER): Payer: No Typology Code available for payment source

## 2022-12-13 DIAGNOSIS — W540XXA Bitten by dog, initial encounter: Secondary | ICD-10-CM

## 2022-12-13 MED ORDER — CLINDAMYCIN HCL 300 MG PO CAPS: 300.0000 mg | ORAL_CAPSULE | Freq: Three times a day (TID) | ORAL | 0 refills | Status: AC

## 2022-12-13 MED ORDER — CIPROFLOXACIN HCL 500 MG PO TABS: 500.0000 mg | ORAL_TABLET | Freq: Once | ORAL | Status: DC

## 2022-12-13 MED ORDER — CLINDAMYCIN HCL 150 MG PO CAPS
300.0000 mg | ORAL_CAPSULE | Freq: Once | ORAL | Status: AC
  Administered 2022-12-13: 300 mg via ORAL
  Filled 2022-12-13: qty 2

## 2022-12-13 MED ORDER — DOXYCYCLINE HYCLATE 100 MG PO TABS
100.0000 mg | ORAL_TABLET | Freq: Once | ORAL | Status: AC
  Administered 2022-12-13: 100 mg via ORAL
  Filled 2022-12-13: qty 1

## 2022-12-13 MED ORDER — TETANUS-DIPHTH-ACELL PERTUSSIS 5-2.5-18.5 LF-MCG/0.5 IM SUSY
0.5000 mL | PREFILLED_SYRINGE | Freq: Once | INTRAMUSCULAR | Status: AC
  Administered 2022-12-13: 0.5 mL via INTRAMUSCULAR
  Filled 2022-12-13: qty 0.5

## 2022-12-13 MED ORDER — RABIES VACCINE, PCEC IM SUSR
1.0000 mL | Freq: Once | INTRAMUSCULAR | Status: AC
  Administered 2022-12-13: 1 mL via INTRAMUSCULAR
  Filled 2022-12-13: qty 1

## 2022-12-13 MED ORDER — RABIES IMMUNE GLOBULIN 150 UNIT/ML IM INJ
20.0000 [IU]/kg | INJECTION | Freq: Once | INTRAMUSCULAR | Status: AC
  Administered 2022-12-13: 2175 [IU] via INTRAMUSCULAR
  Filled 2022-12-13: qty 16

## 2022-12-13 MED ORDER — DOXYCYCLINE HYCLATE 100 MG PO CAPS: 100.0000 mg | ORAL_CAPSULE | Freq: Two times a day (BID) | ORAL | 0 refills | Status: AC

## 2022-12-13 NOTE — Discharge Instructions (Addendum)
                                  RABIES VACCINE FOLLOW UP  Patient's Name: Luke Nguyen                     Original Order Date:12/13/2022  Medical Record Number: [id number]  ED Physician: Maia Plan, MD Primary Diagnosis: Rabies Exposure       PCP: Pcp, No  Patient Phone Number: (home) [phone] (home)    (cell)  Telephone Information:  Mobile [phone]    (work) There is no work phone number on file. Species of Animal:  Dog   You have been seen in the Emergency Department for a possible rabies exposure. It's very important you return for the additional vaccine doses.  Please call the clinic listed below for hours of operation.   Clinic that will administer your rabies vaccines:  [facility] [address] [place], [place] [address]  DAY 0:  12/13/2022      DAY 3:  12/16/2022       DAY 7:  12/20/2022     DAY 14:  12/27/2022         The 5th vaccine injection is considered for immune compromised patients only.  DAY 28:  01/10/2023

## 2022-12-13 NOTE — ED Provider Notes (Signed)
Emergency Department Provider Note   I have reviewed the triage vital signs and the nursing notes.   HISTORY  Chief Complaint Animal Bite   HPI Luke Nguyen is a 22 y.o. male with past history reviewed below presents emergency department with dog bite to the left wrist.  He was bitten by a neighbors dog and is unsure of the vaccine status.  He did not discuss this with the neighbor.  There is a small break in the skin with some bruising.  Unsure of his last tetanus.  No active bleeding.  No additional areas of injury.   Past Medical History:  Diagnosis Date   ADD (attention deficit disorder)    no current med.   Asthma    daily and prn inhalers   Environmental allergies    takes weekly allergy shots   Stress fracture of left tibia 02/2016    Review of Systems  Constitutional: No fever/chills Cardiovascular: Denies chest pain. Respiratory: Denies shortness of breath. Gastrointestinal: No abdominal pain. Skin: Animal bite to the left wrist.   ____________________________________________   PHYSICAL EXAM:  VITAL SIGNS: ED Triage Vitals  Encounter Vitals Group     BP 12/12/22 2308 134/71     Pulse Rate 12/12/22 2308 69     Resp 12/12/22 2308 18     Temp 12/12/22 2308 98.3 F (36.8 C)     Temp Source 12/12/22 2308 Oral     SpO2 12/12/22 2308 95 %     Weight 12/12/22 2313 237 lb (107.5 kg)     Height 12/12/22 2313 6\' 1"  (1.854 m)   Constitutional: Alert and oriented. Well appearing and in no acute distress. Eyes: Conjunctivae are normal.  Head: Atraumatic. Nose: No congestion/rhinnorhea. Mouth/Throat: Mucous membranes are moist.   Neck: No stridor.  Cardiovascular: Normal rate, regular rhythm. Good peripheral circulation.  Respiratory: Normal respiratory effort.   Gastrointestinal: No distention.  Musculoskeletal: Mild swelling and bruising to the distal left forearm.  Neurologic:  Normal speech and language. No gross focal neurologic deficits are  appreciated.  Skin:  Skin is warm and dry. Punctate area of skin breakdown/puncture to the distal forearm.   ____________________________________________  RADIOLOGY  DG Forearm Left  Result Date: 12/13/2022 CLINICAL DATA:  Dog bite to left wrist. EXAM: LEFT FOREARM - 2 VIEW COMPARISON:  11/29/2005. FINDINGS: There is no evidence of fracture or other focal bone lesions. Mild soft tissue swelling is present at the wrist. No radiopaque foreign body is seen. IMPRESSION: No acute osseous abnormality. Electronically Signed   By: Thornell Sartorius M.D.   On: 12/13/2022 01:40    ____________________________________________   PROCEDURES  Procedure(s) performed:   Procedures  None  ____________________________________________   INITIAL IMPRESSION / ASSESSMENT AND PLAN / ED COURSE  Pertinent labs & imaging results that were available during my care of the patient were reviewed by me and considered in my medical decision making (see chart for details).   This patient is Presenting for Evaluation of animal bite, which does require a range of treatment options, and is a complaint that involves a moderate risk of morbidity and mortality.  The Differential Diagnoses include animal bite, contusion, retained foreign body, compartment syndrome, etc.  Critical Interventions-    Medications  rabies immune globulin (HYPERRAB/KEDRAB) injection 2,175 Units (2,175 Units Intramuscular Given 12/13/22 0303)  rabies vaccine (RABAVERT) injection 1 mL (1 mL Intramuscular Given 12/13/22 0300)  Tdap (BOOSTRIX) injection 0.5 mL (0.5 mLs Intramuscular Given 12/13/22 0257)  clindamycin (CLEOCIN)  capsule 300 mg (300 mg Oral Given 12/13/22 0256)  doxycycline (VIBRA-TABS) tablet 100 mg (100 mg Oral Given 12/13/22 0256)    Radiologic Tests Ordered, included forearm XR. I independently interpreted the images and agree with radiology interpretation.   Medical Decision Making: Summary:  Patient presents to the emergency  department after a dog bite.  Unsure of the vaccination status of the dog.  Animal control not involved.  Will proceed with rabies vaccine and immunoglobulin in the setting.  Also plan to update tetanus and start antibiotics.  He has a penicillin allergy listed. Discussed follow up [facility] at [facility] and gave dates/location on these additional vaccines.   Patient's presentation is most consistent with acute, uncomplicated illness.   Disposition: discharge  ____________________________________________  FINAL CLINICAL IMPRESSION(S) / ED DIAGNOSES  Final diagnoses:  Dog bite, initial encounter     NEW OUTPATIENT MEDICATIONS STARTED DURING THIS VISIT:  Discharge Medication List as of 12/13/2022  3:34 AM     START taking these medications   Details  clindamycin (CLEOCIN) 300 MG capsule Take 1 capsule (300 mg total) by mouth 3 (three) times daily for 7 days., Starting Sat 12/13/2022, Until Sat 12/20/2022, Normal    doxycycline (VIBRAMYCIN) 100 MG capsule Take 1 capsule (100 mg total) by mouth 2 (two) times daily for 7 days., Starting Sat 12/13/2022, Until Sat 12/20/2022, Normal        Note:  This document was prepared using Dragon voice recognition software and may include unintentional dictation errors.  Alona Bene, MD, [facility] Emergency Medicine    Adriyanna Christians, Arlyss Repress, MD 12/14/22 [phone]
